# Patient Record
Sex: Female | Born: 2000 | Race: Black or African American | Hispanic: No | Marital: Single | Smoking: Never smoker
Health system: Southern US, Community
[De-identification: ages and names within clinical notes are randomized; demographics above are authoritative.]

## PROBLEM LIST (undated history)

## (undated) DIAGNOSIS — J45909 Unspecified asthma, uncomplicated: Secondary | ICD-10-CM

## (undated) DIAGNOSIS — E871 Hypo-osmolality and hyponatremia: Secondary | ICD-10-CM

## (undated) DIAGNOSIS — D509 Iron deficiency anemia, unspecified: Secondary | ICD-10-CM

## (undated) DIAGNOSIS — Z0000 Encounter for general adult medical examination without abnormal findings: Secondary | ICD-10-CM

---
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
## 2022-06-09 ENCOUNTER — Ambulatory Visit
Admit: 2022-06-09 | Discharge: 2022-06-09 | Payer: TRICARE (CHAMPUS) | Attending: Internal Medicine | Primary: Internal Medicine

## 2022-06-09 DIAGNOSIS — Z0000 Encounter for general adult medical examination without abnormal findings: Secondary | ICD-10-CM

## 2022-06-09 MED ORDER — FLUTICASONE PROPIONATE 50 MCG/ACT NA SUSP
50 MCG/ACT | Freq: Every day | NASAL | 0 refills | Status: AC
Start: 2022-06-09 — End: ?

## 2022-06-09 MED ORDER — LORATADINE 10 MG PO TABS
10 MG | ORAL_TABLET | Freq: Every day | ORAL | 1 refills | Status: DC
Start: 2022-06-09 — End: 2022-07-07

## 2022-06-09 NOTE — Progress Notes (Unsigned)
Date of Visit: 06/09/2022    Catherine Jefferson (DOB:  December 28, 2000) is a 21 y.o. female,  Established patient here for evaluation of the following chief complaint(s):  Establish Care and Annual Exam      ASSESSMENT/PLAN:    1. Annual physical exam  -     CBC with Auto Differential; Future  -     Comprehensive Metabolic Panel; Future  -     Lipid Panel; Future  -     TSH; Future  -     Urinalysis; Future  2. Allergic rhinitis, unspecified seasonality, unspecified trigger  -     loratadine (CLARITIN) 10 MG tablet; Take 1 tablet by mouth daily, Disp-30 tablet, R-1Normal  -     fluticasone (FLONASE) 50 MCG/ACT nasal spray; 2 sprays by Each Nostril route daily, Disp-16 g, R-0Normal      Return in about 4 weeks (around 07/07/2022) for allergic rhinitis.    SUBJECTIVE:    Patient presents to establish care and annual physical exam.     Patient hasn't had a pap smear.    LMP 06/02/22.     Patient doesn't exercise.    Patient is a Consulting civil engineer at [employer]    Nose is runny , stuffy all year round. Itchy eyes.occasional post nasal drainage   Takes allergy medication during allergy season. Patient takes benadryl           Review of Systems   Constitutional:  Negative for activity change, appetite change, chills, diaphoresis, fatigue, fever and unexpected weight change.   HENT:  Negative for congestion, ear discharge, ear pain, facial swelling, hearing loss, mouth sores, nosebleeds, postnasal drip, rhinorrhea, sinus pressure, sinus pain, sneezing, sore throat, tinnitus, trouble swallowing and voice change.    Eyes:  Negative for photophobia, pain, discharge, redness, itching and visual disturbance.   Respiratory:  Negative for apnea, cough, choking, chest tightness, shortness of breath and wheezing.    Cardiovascular:  Negative for chest pain, palpitations and leg swelling.   Gastrointestinal:  Negative for abdominal distention, abdominal pain, anal bleeding, blood in stool, constipation, diarrhea, nausea, rectal pain  and vomiting.   Endocrine: Negative for cold intolerance and heat intolerance.   Genitourinary:  Negative for decreased urine volume, difficulty urinating, dysuria, flank pain, frequency, genital sores, hematuria, menstrual problem, pelvic pain, urgency, vaginal bleeding, vaginal discharge and vaginal pain.   Musculoskeletal:  Negative for arthralgias, back pain, gait problem, joint swelling, myalgias, neck pain and neck stiffness.   Skin:  Negative for rash and wound.   Neurological:  Negative for dizziness, tremors, seizures, syncope, facial asymmetry, speech difficulty, weakness, light-headedness, numbness and headaches.   Hematological:  Negative for adenopathy. Does not bruise/bleed easily.   Psychiatric/Behavioral:  Negative for decreased concentration, dysphoric mood and sleep disturbance. The patient is not nervous/anxious.    All other systems reviewed and are negative.      No Known Allergies    Outpatient Medications Marked as Taking for the 06/09/22 encounter (Office Visit) with Jonell Cluck, MD   Medication Sig Dispense Refill    loratadine (CLARITIN) 10 MG tablet Take 1 tablet by mouth daily 30 tablet 1    fluticasone (FLONASE) 50 MCG/ACT nasal spray 2 sprays by Each Nostril route daily 16 g 0       Social History     Tobacco Use    Smoking status: Never    Smokeless tobacco: Never   Substance Use Topics    Alcohol use: Yes  Comment: Occasional    Drug use: Never         OBJECTIVE:    Vitals:    06/09/22 1015   BP: 106/68   Pulse: 89   Resp: 16   Temp: 97.7 F (36.5 C)   SpO2: 100%   Weight: 52.6 kg (116 lb)   Height: 1.6 m (5\' 3" )     Body mass index is 20.55 kg/m.    Wt Readings from Last 3 Encounters:   06/09/22 52.6 kg (116 lb)       Physical Exam    No results found for this visit on 06/09/22.  Lab Review {Recent labs:19471}        Medications, Allergies, Social history, Medical history, and results reviewed      An electronic signature was used to authenticate this note.    Raynelle Bring, MD

## 2022-06-09 NOTE — Patient Instructions (Signed)
-  Fast 8-10 hours for labs

## 2022-06-12 ENCOUNTER — Encounter

## 2022-06-13 LAB — URINALYSIS
Bilirubin Urine: NEGATIVE
Blood, Urine: NEGATIVE
Glucose, Ur: NEGATIVE mg/dL
Ketones, Urine: NEGATIVE mg/dL
Nitrite, Urine: NEGATIVE
Protein, UA: NEGATIVE mg/dL
Specific Gravity, UA: 1.011 (ref 1.005–1.030)
Urobilinogen, Urine: 0.2 E.U./dL (ref <2.0)
pH, UA: 6 (ref 5.0–8.0)

## 2022-06-13 LAB — LIPID PANEL
Cholesterol, Total: 141 mg/dL (ref 0–199)
HDL: 35 mg/dL — ABNORMAL LOW (ref 40–60)
LDL Calculated: 97 mg/dL (ref <100)
Triglycerides: 47 mg/dL (ref 0–150)
VLDL Cholesterol Calculated: 9 mg/dL

## 2022-06-13 LAB — CBC WITH AUTO DIFFERENTIAL
Basophils %: 1.2 %
Basophils Absolute: 0.1 10*3/uL (ref 0.0–0.2)
Eosinophils %: 0.5 %
Eosinophils Absolute: 0 10*3/uL (ref 0.0–0.6)
Hematocrit: 35.3 % — ABNORMAL LOW (ref 36.0–48.0)
Hemoglobin: 11.5 g/dL — ABNORMAL LOW (ref 12.0–16.0)
Lymphocytes %: 25.7 %
Lymphocytes Absolute: 1.2 10*3/uL (ref 1.0–5.1)
MCH: 26.3 pg (ref 26.0–34.0)
MCHC: 32.7 g/dL (ref 31.0–36.0)
MCV: 80.5 fL (ref 80.0–100.0)
MPV: 7.4 fL (ref 5.0–10.5)
Monocytes %: 10 %
Monocytes Absolute: 0.5 10*3/uL (ref 0.0–1.3)
Neutrophils %: 62.6 %
Neutrophils Absolute: 3 10*3/uL (ref 1.7–7.7)
Platelets: 379 10*3/uL (ref 135–450)
RBC: 4.38 M/uL (ref 4.00–5.20)
RDW: 14.9 % (ref 12.4–15.4)
WBC: 4.8 10*3/uL (ref 4.0–11.0)

## 2022-06-13 LAB — COMPREHENSIVE METABOLIC PANEL
ALT: 10 U/L (ref 10–40)
AST: 15 U/L (ref 15–37)
Albumin/Globulin Ratio: 1.5 (ref 1.1–2.2)
Albumin: 4.6 g/dL (ref 3.4–5.0)
Alkaline Phosphatase: 97 U/L (ref 40–129)
Anion Gap: 11 (ref 3–16)
BUN: 16 mg/dL (ref 7–20)
CO2: 24 mmol/L (ref 21–32)
Calcium: 9.1 mg/dL (ref 8.3–10.6)
Chloride: 98 mmol/L — ABNORMAL LOW (ref 99–110)
Creatinine: 0.6 mg/dL (ref 0.6–1.1)
Est, Glom Filt Rate: >60 (ref >60)
Glucose: 82 mg/dL (ref 70–99)
Potassium: 4.1 mmol/L (ref 3.5–5.1)
Sodium: 133 mmol/L — ABNORMAL LOW (ref 136–145)
Total Bilirubin: 0.4 mg/dL (ref 0.0–1.0)
Total Protein: 7.7 g/dL (ref 6.4–8.2)

## 2022-06-13 LAB — TSH: TSH: 1.67 u[IU]/mL (ref 0.27–4.20)

## 2022-06-13 LAB — MICROSCOPIC URINALYSIS

## 2022-07-07 ENCOUNTER — Encounter: Payer: TRICARE (CHAMPUS) | Attending: Internal Medicine | Primary: Internal Medicine

## 2022-07-07 ENCOUNTER — Ambulatory Visit
Admit: 2022-07-07 | Discharge: 2022-07-07 | Payer: TRICARE (CHAMPUS) | Attending: Internal Medicine | Primary: Internal Medicine

## 2022-07-07 ENCOUNTER — Encounter

## 2022-07-07 DIAGNOSIS — J309 Allergic rhinitis, unspecified: Secondary | ICD-10-CM

## 2022-07-07 MED ORDER — LORATADINE 10 MG PO TABS
10 MG | ORAL_TABLET | Freq: Every day | ORAL | 1 refills | Status: AC
Start: 2022-07-07 — End: 2023-01-03

## 2022-07-07 NOTE — Progress Notes (Unsigned)
Date of Visit: 07/07/2022    Catherine Jefferson (DOB:  10/22/2000) is a 22 y.o. female,  Established patient here for evaluation of the following chief complaint(s):  Allergic Rhinitis       ASSESSMENT/PLAN:    1. Allergic rhinitis, unspecified seasonality, unspecified trigger  -     loratadine (CLARITIN) 10 MG tablet; Take 1 tablet by mouth daily, Disp-90 tablet, R-1Normal  2. Anemia, unspecified type  -     CBC; Future  -     Iron and TIBC; Future  3. Hyponatremia  -     Basic Metabolic Panel; Future  4. Leukocytes in urine      No follow-ups on file.    SUBJECTIVE:    Meds are helping. Takes Lorat daily flonase every 2-3 days   Doing good right now  Nose stuffy today due to weather        Review of Systems    No Known Allergies    Outpatient Medications Marked as Taking for the 07/07/22 encounter (Office Visit) with Jonell Cluck, MD   Medication Sig Dispense Refill    loratadine (CLARITIN) 10 MG tablet Take 1 tablet by mouth daily 30 tablet 1    fluticasone (FLONASE) 50 MCG/ACT nasal spray 2 sprays by Each Nostril route daily 16 g 0       Social History     Tobacco Use    Smoking status: Never    Smokeless tobacco: Never   Substance Use Topics    Alcohol use: Yes     Comment: Occasional    Drug use: Never         OBJECTIVE:    Vitals:    07/07/22 1201   BP: 100/64   Site: Left Upper Arm   Position: Sitting   Pulse: 82   Resp: 16   Temp: 97.3 F (36.3 C)   SpO2: 99%   Weight: 52.6 kg (116 lb)   Height: 1.6 m (5\' 3" )     Body mass index is 20.55 kg/m.    Wt Readings from Last 3 Encounters:   07/07/22 52.6 kg (116 lb)   06/09/22 52.6 kg (116 lb)       Physical Exam    No results found for this visit on 07/07/22.  Lab Review   Orders Only on 06/12/2022   Component Date Value    Color, UA 06/12/2022 Yellow     Clarity, UA 06/12/2022 Clear     Glucose, Ur 06/12/2022 Negative     Bilirubin Urine 06/12/2022 Negative     Ketones, Urine 06/12/2022 Negative     Specific Gravity, UA 06/12/2022 1.011     Blood, Urine  06/12/2022 Negative     pH, UA 06/12/2022 6.0     Protein, UA 06/12/2022 Negative     Urobilinogen, Urine 06/12/2022 0.2     Nitrite, Urine 06/12/2022 Negative     Leukocyte Esterase, Urine 06/12/2022 MODERATE (A)     Microscopic Examination 06/12/2022 YES     Urine Type 06/12/2022 Cleancatch     TSH 06/12/2022 1.67     Cholesterol, Total 06/12/2022 141     Triglycerides 06/12/2022 47     HDL 06/12/2022 35 (L)     LDL Calculated 06/12/2022 97     VLDL Cholesterol Calcula* 06/12/2022 9     Sodium 06/12/2022 133 (L)     Potassium 06/12/2022 4.1     Chloride 06/12/2022 98 (L)     CO2 06/12/2022 24  Anion Gap 06/12/2022 11     Glucose 06/12/2022 82     BUN 06/12/2022 16     Creatinine 06/12/2022 0.6     Est, Glom Filt Rate 06/12/2022 >60     Calcium 06/12/2022 9.1     Total Protein 06/12/2022 7.7     Albumin 06/12/2022 4.6     Albumin/Globulin Ratio 06/12/2022 1.5     Total Bilirubin 06/12/2022 0.4     Alkaline Phosphatase 06/12/2022 97     ALT 06/12/2022 10     AST 06/12/2022 15     WBC 06/12/2022 4.8     RBC 06/12/2022 4.38     Hemoglobin 06/12/2022 11.5 (L)     Hematocrit 06/12/2022 35.3 (L)     MCV 06/12/2022 80.5     MCH 06/12/2022 26.3     MCHC 06/12/2022 32.7     RDW 06/12/2022 14.9     Platelets 06/12/2022 379     MPV 06/12/2022 7.4     Neutrophils % 06/12/2022 62.6     Lymphocytes % 06/12/2022 25.7     Monocytes % 06/12/2022 10.0     Eosinophils % 06/12/2022 0.5     Basophils % 06/12/2022 1.2     Neutrophils Absolute 06/12/2022 3.0     Lymphocytes Absolute 06/12/2022 1.2     Monocytes Absolute 06/12/2022 0.5     Eosinophils Absolute 06/12/2022 0.0     Basophils Absolute 06/12/2022 0.1     WBC, UA 06/12/2022 6-9 (A)     RBC, UA 06/12/2022 0-2     Epithelial Cells, UA 06/12/2022 6-10 (A)     Bacteria, UA 06/12/2022 1+ (A)     Urinalysis Comments 06/12/2022 see below            Medications, Allergies, Social history, Medical history, and results reviewed      An electronic signature was used to authenticate  this note.    Raynelle Bring, MD

## 2022-07-07 NOTE — Patient Instructions (Signed)
-  Continue same medications

## 2022-07-08 LAB — BASIC METABOLIC PANEL
Anion Gap: 7 (ref 3–16)
BUN: 13 mg/dL (ref 7–20)
CO2: 26 mmol/L (ref 21–32)
Calcium: 8.9 mg/dL (ref 8.3–10.6)
Chloride: 107 mmol/L (ref 99–110)
Creatinine: 0.7 mg/dL (ref 0.6–1.1)
Est, Glom Filt Rate: >60 (ref >60)
Glucose: 103 mg/dL — ABNORMAL HIGH (ref 70–99)
Potassium: 3.8 mmol/L (ref 3.5–5.1)
Sodium: 140 mmol/L (ref 136–145)

## 2022-07-08 LAB — IRON AND TIBC
Iron Saturation: 7 % — ABNORMAL LOW (ref 15–50)
Iron: 27 ug/dL — ABNORMAL LOW (ref 37–145)
TIBC: 406 ug/dL (ref 260–445)

## 2022-07-08 LAB — CBC
Hematocrit: 33.9 % — ABNORMAL LOW (ref 36.0–48.0)
Hemoglobin: 10.9 g/dL — ABNORMAL LOW (ref 12.0–16.0)
MCH: 25.4 pg — ABNORMAL LOW (ref 26.0–34.0)
MCHC: 32.2 g/dL (ref 31.0–36.0)
MCV: 78.9 fL — ABNORMAL LOW (ref 80.0–100.0)
MPV: 7.3 fL (ref 5.0–10.5)
Platelets: 389 10*3/uL (ref 135–450)
RBC: 4.3 M/uL (ref 4.00–5.20)
RDW: 14.8 % (ref 12.4–15.4)
WBC: 4.6 10*3/uL (ref 4.0–11.0)

## 2022-07-16 ENCOUNTER — Encounter

## 2022-07-16 MED ORDER — FERROUS SULFATE 325 (65 FE) MG PO TABS
32565 (65 Fe) MG | ORAL_TABLET | Freq: Every day | ORAL | 3 refills | Status: AC
Start: 2022-07-16 — End: 2023-05-10

## 2023-01-08 ENCOUNTER — Encounter: Payer: TRICARE (CHAMPUS) | Attending: Internal Medicine | Primary: Internal Medicine

## 2023-01-08 NOTE — Telephone Encounter (Signed)
No show for Dr Clovis Riley

## 2023-05-10 ENCOUNTER — Inpatient Hospital Stay: Admit: 2023-05-10 | Payer: TRICARE (CHAMPUS) | Primary: Internal Medicine

## 2023-05-10 ENCOUNTER — Encounter

## 2023-05-10 ENCOUNTER — Encounter
Admit: 2023-05-10 | Discharge: 2023-05-10 | Payer: TRICARE (CHAMPUS) | Attending: Internal Medicine | Primary: Internal Medicine

## 2023-05-10 VITALS — BP 98/70 | HR 91 | Wt 109.4 lb

## 2023-05-10 DIAGNOSIS — M545 Low back pain, unspecified: Secondary | ICD-10-CM

## 2023-05-10 DIAGNOSIS — M25552 Pain in left hip: Secondary | ICD-10-CM

## 2023-05-10 MED ORDER — MELOXICAM 7.5 MG PO TABS
7.5 | ORAL_TABLET | Freq: Every day | ORAL | 0 refills | Status: AC
Start: 2023-05-10 — End: ?

## 2023-05-10 NOTE — Progress Notes (Unsigned)
Date of Visit: 05/10/2023    Catherine Jefferson (DOB:  12/23/2000) is a 22 y.o. female,  Established patient here for evaluation of the following chief complaint(s):  Back Pain and Hip Pain (Bilateral, more the left than right per patient. )      ASSESSMENT/PLAN:    {There are no diagnoses linked to this encounter. (Refresh or delete this SmartLink)}    No follow-ups on file.    SUBJECTIVE:    Back pain for 1 year. Patient denies injury. Lower back. Aleve only alleviates it for a little while sharp pain and pressure like something is sitting on her.   Recently daily since 2 weeks ago  At [employer] training and did a fitness test and felt like pulled a muscle and could barely move   Told to stretch but it didn't helped      Patient complains of left hip pain x 1 year no injury  Hip pain noticeable with sleeping. Can't sleep on left side.  Hip pain is sharp and constant.    Iron deficiency took iron for 30 days and pharmacy did not have refills.  Hip and back worse if prolonged sit and no back support        Review of Systems    No Known Allergies    Prior to Visit Medications    Medication Sig Taking? Authorizing Provider   ferrous sulfate (IRON 325) 325 (65 Fe) MG tablet Take 1 tablet by mouth daily (with breakfast) Yes Jonell Cluck, MD   fluticasone (FLONASE) 50 MCG/ACT nasal spray 2 sprays by Each Nostril route daily Yes Jonell Cluck, MD       Social History     Tobacco Use    Smoking status: Never    Smokeless tobacco: Never   Substance Use Topics    Alcohol use: Yes     Comment: Occasional    Drug use: Never         OBJECTIVE:    Vitals:    05/10/23 1133   BP: 98/70   Pulse: 91   SpO2: 97%   Weight: 49.6 kg (109 lb 6.4 oz)     Body mass index is 19.38 kg/m.    Wt Readings from Last 3 Encounters:   05/10/23 49.6 kg (109 lb 6.4 oz)   07/07/22 52.6 kg (116 lb)   06/09/22 52.6 kg (116 lb)       Physical Exam  Musculoskeletal:      Lumbar back: Tenderness (left low back) present. No spasms. Decreased  range of motion.      Left hip: Tenderness present. Normal range of motion.         No results found for this visit on 05/10/23.  Lab Review {Recent labs:19471}        Medications, Allergies, Social history, Medical history, and results reviewed      An electronic signature was used to authenticate this note.    Raynelle Bring, MD

## 2023-05-10 NOTE — Patient Instructions (Signed)
Referral to Orthopedics

## 2023-05-11 LAB — CBC
Hematocrit: 36.5 % (ref 36.0–48.0)
Hemoglobin: 11.5 g/dL — ABNORMAL LOW (ref 12.0–16.0)
MCH: 24.8 pg — ABNORMAL LOW (ref 26.0–34.0)
MCHC: 31.6 g/dL (ref 31.0–36.0)
MCV: 78.4 fL — ABNORMAL LOW (ref 80.0–100.0)
MPV: 7.6 fL (ref 5.0–10.5)
Platelets: 380 10*3/uL (ref 135–450)
RBC: 4.65 M/uL (ref 4.00–5.20)
RDW: 15.3 % (ref 12.4–15.4)
WBC: 3.7 10*3/uL — ABNORMAL LOW (ref 4.0–11.0)

## 2023-05-11 LAB — IRON AND TIBC
Iron % Saturation: 18 % (ref 15–50)
Iron: 84 ug/dL (ref 37–145)
TIBC: 472 ug/dL — ABNORMAL HIGH (ref 260–445)

## 2023-05-19 NOTE — Assessment & Plan Note (Addendum)
-  CBC as previously ordered  -Iron and TIBC as previously ordered  -May need to resume iron if low on labs

## 2023-05-19 NOTE — Assessment & Plan Note (Signed)
-  Not controlled  -Left hip xray  -Start meloxicam 7.5 mg once daily  -Referral to Orthopedics

## 2023-05-19 NOTE — Assessment & Plan Note (Addendum)
-  Not controlled  -Lumbar spine xray  -Start meloxicam 7.5 mg once daily  -Referral to Orthopedics

## 2023-05-21 ENCOUNTER — Ambulatory Visit: Admit: 2023-05-21 | Payer: TRICARE (CHAMPUS) | Primary: Internal Medicine

## 2023-05-21 ENCOUNTER — Encounter: Admit: 2023-05-21 | Payer: TRICARE (CHAMPUS) | Admitting: Orthopaedic Surgery | Primary: Internal Medicine

## 2023-05-21 VITALS — Ht 62.99 in | Wt 109.0 lb

## 2023-05-21 DIAGNOSIS — M25552 Pain in left hip: Principal | ICD-10-CM

## 2023-05-21 DIAGNOSIS — M25852 Other specified joint disorders, left hip: Secondary | ICD-10-CM

## 2023-05-21 MED ORDER — BETAMETHASONE SOD PHOS & ACET 6 (3-3) MG/ML IJ SUSP
6 | Freq: Once | INTRAMUSCULAR | Status: AC
Start: 2023-05-21 — End: 2023-05-21
  Administered 2023-05-21: 19:00:00 12 mg via INTRA_ARTICULAR

## 2023-05-21 MED ORDER — LIDOCAINE HCL 1 % IJ SOLN
1 | Freq: Once | INTRAMUSCULAR | Status: AC
Start: 2023-05-21 — End: 2023-05-21
  Administered 2023-05-21: 19:00:00 5 mL via INTRA_ARTICULAR

## 2023-05-21 MED ORDER — BUPIVACAINE HCL 0.25 % IJ SOLN
0.25 | Freq: Once | INTRAMUSCULAR | Status: AC
Start: 2023-05-21 — End: 2023-05-21
  Administered 2023-05-21: 19:00:00 75 mL via INTRA_ARTICULAR

## 2023-05-21 NOTE — Progress Notes (Signed)
 Dr Darrol Jump      Date /Time 05/21/2023       2:20 PM EST  Name Alydia Gosser Arington             09/04/2000   Location  [facility]  MRN [id number]                Chief Complaint   Patient presents with    New Patient     N Left Hip XR 05/10/23 ([facility]

## 2023-07-07 ENCOUNTER — Encounter: Payer: TRICARE (CHAMPUS) | Attending: Physician Assistant | Primary: Internal Medicine

## 2023-07-21 ENCOUNTER — Encounter: Payer: TRICARE (CHAMPUS) | Attending: Physician Assistant | Primary: Internal Medicine

## 2023-08-02 ENCOUNTER — Encounter: Payer: TRICARE (CHAMPUS) | Attending: Internal Medicine | Primary: Internal Medicine

## 2023-08-02 NOTE — Telephone Encounter (Signed)
Patient was called and she stated that she forgot to cancel--no show
